# Patient Record
Sex: Female | Born: 1990 | Race: White | Hispanic: No | Marital: Single | Smoking: Never smoker
Health system: Southern US, Community
[De-identification: ages and names within clinical notes are randomized; demographics above are authoritative.]

## PROBLEM LIST (undated history)

## (undated) HISTORY — PX: OTHER SURGICAL HISTORY: SHX169

---
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
## 2010-02-23 ENCOUNTER — Emergency Department (HOSPITAL_COMMUNITY): Admission: EM | Admit: 2010-02-23 | Discharge: 2010-02-23 | Payer: Self-pay | Admitting: Emergency Medicine

## 2011-01-17 ENCOUNTER — Emergency Department (HOSPITAL_BASED_OUTPATIENT_CLINIC_OR_DEPARTMENT_OTHER)
Admission: EM | Admit: 2011-01-17 | Discharge: 2011-01-17 | Disposition: A | Discharge status: Home / Self Care | Payer: Self-pay | Attending: Emergency Medicine | Admitting: Emergency Medicine

## 2011-01-17 DIAGNOSIS — R51 Headache: Secondary | ICD-10-CM | POA: Insufficient documentation

## 2011-02-11 LAB — POCT I-STAT, CHEM 8
BUN: 12 mg/dL (ref 6–23)
Calcium, Ion: 1.24 mmol/L (ref 1.12–1.32)
Chloride: 108 mEq/L (ref 96–112)
Creatinine, Ser: 0.6 mg/dL (ref 0.4–1.2)
Glucose, Bld: 96 mg/dL (ref 70–99)

## 2013-05-20 ENCOUNTER — Encounter (HOSPITAL_COMMUNITY): Payer: Self-pay | Admitting: *Deleted

## 2013-05-20 ENCOUNTER — Emergency Department (HOSPITAL_COMMUNITY): Payer: Self-pay

## 2013-05-20 ENCOUNTER — Emergency Department (HOSPITAL_COMMUNITY)
Admission: EM | Admit: 2013-05-20 | Discharge: 2013-05-20 | Disposition: A | Discharge status: Home / Self Care | Payer: Self-pay | Attending: Emergency Medicine | Admitting: Emergency Medicine

## 2013-05-20 DIAGNOSIS — R1310 Dysphagia, unspecified: Secondary | ICD-10-CM | POA: Insufficient documentation

## 2013-05-20 DIAGNOSIS — J029 Acute pharyngitis, unspecified: Secondary | ICD-10-CM | POA: Insufficient documentation

## 2013-05-20 MED ORDER — ONDANSETRON 8 MG PO TBDP: 8.0000 mg | ORAL_TABLET | Freq: Once | ORAL | Status: DC

## 2013-05-20 MED ORDER — IOHEXOL 300 MG/ML  SOLN
100.0000 mL | Freq: Once | INTRAMUSCULAR | Status: AC | PRN
  Administered 2013-05-20: 100 mL via INTRAVENOUS

## 2013-05-20 MED ORDER — SODIUM CHLORIDE 0.9 % IV SOLN
Freq: Once | INTRAVENOUS | Status: AC
  Administered 2013-05-20: 20 mL/h via INTRAVENOUS

## 2013-05-20 NOTE — ED Provider Notes (Signed)
Medical screening examination/treatment/procedure(s) were performed by non-physician practitioner and as supervising physician I was immediately available for consultation/collaboration.   Nahomi Hegner, MD 05/20/13 2327 

## 2013-05-20 NOTE — ED Provider Notes (Signed)
History    CSN: [id number] Arrival date & time 05/20/13  1907  First MD Initiated Contact with Patient 05/20/13 2007     Chief Complaint  Patient presents with  . Foreign Body   (Consider location/radiation/quality/duration/timing/severity/associated sxs/prior Treatment) Patient is a 22 y.o. female presenting with foreign body. The history is provided by the patient.  Foreign Body Location:  Swallowed Suspected object:  Food Pain quality:  Aching and tightness Associated symptoms: sore throat and trouble swallowing   Associated symptoms: no abdominal pain, no cough, no nausea and no voice change   Associated symptoms comment:  She was eating chicken around 1:00 this afternoon and felt as though it got stuck in her throat. She induced vomiting and feels she got the swallowed chicken out but continues to have pain when swallowing. Currently unable to manage her own secretions. No breathing difficulties.  History reviewed. No pertinent past medical history. Past Surgical History  Procedure Laterality Date  . Arm surgery     No family history on file. History  Substance Use Topics  . Smoking status: Never Smoker   . Smokeless tobacco: Not on file  . Alcohol Use: Yes     Comment: occ   OB History   Grav Para Term Preterm Abortions TAB SAB Ect Mult Living                 Review of Systems  Constitutional: Negative for fever.  HENT: Positive for sore throat and trouble swallowing. Negative for voice change.   Respiratory: Negative for cough and shortness of breath.   Gastrointestinal: Negative for nausea and abdominal pain.    Allergies  Review of patient's allergies indicates no known allergies.  Home Medications   Current Outpatient Rx  Name  Route  Sig  Dispense  Refill  . norgestimate-ethinyl estradiol (ORTHO-CYCLEN,SPRINTEC,PREVIFEM) 0.25-35 MG-MCG tablet   Oral   Take 1 tablet by mouth every morning.          BP 124/79  Pulse 97  Temp(Src) 98.9 F (37.2  C) (Oral)  Resp 18  Wt 180 lb (81.647 kg)  SpO2 100%  LMP 04/13/2013 Physical Exam  Constitutional: She is oriented to person, place, and time. She appears well-developed and well-nourished.  HENT:  Head: Normocephalic.  Eyes: Conjunctivae are normal.  Neck: Normal range of motion. Neck supple. No tracheal deviation present. No thyromegaly present.  Cardiovascular: Normal rate and regular rhythm.   Pulmonary/Chest: Effort normal and breath sounds normal. No stridor. She has no wheezes. She has no rales.  Musculoskeletal: Normal range of motion.  Neurological: She is alert and oriented to person, place, and time.  Skin: Skin is warm and dry.  Psychiatric: She has a normal mood and affect.    ED Course  Procedures (including critical care time) Labs Reviewed - No data to display Dg Neck Soft Tissue  05/20/2013   *RADIOLOGY REPORT*  Clinical Data: Eating check-in.  Feels like something is stuck in throat.  NECK SOFT TISSUES - 1+ VIEW  Comparison: None.  Findings: Soft tissue shadows are normal.  Bony and cartilaginous densities are normal.  No sign of radiodense foreign object.  It does appear on the lateral view that there could be an air-fluid level and one of the piriform sinuses.  This is not necessarily a pathologic finding.  IMPRESSION: No radiodense foreign object.  Apparent air-fluid level in one of the piriform sinuses on the lateral view.  I cannot identify this on the frontal view.  This is not definitely a pathologic finding. If concern is high, neck CT could be performed.   Original Report Authenticated By: Paulina Fusi, M.D.  Ct Soft Tissue Neck W Contrast  05/20/2013   *RADIOLOGY REPORT*  Clinical Data: Difficulty swallowing.  Possible chicken bone stuck in throat.  CT NECK WITH CONTRAST  Technique:  Multidetector CT imaging of the neck was performed with intravenous contrast.  Contrast: OMNIPAQUE IOHEXOL 300 MG/ML  SOLN  Comparison: Plain films earlier in the day.   Findings: Clear lung apices. Mild mucosal thickening bilateral maxillary sinuses.  Mucous retention cyst or polyp in the left maxillary sinus.  Normal nasopharynx, oropharynx.  Motion degradation involves the level of the epiglottis and piriform sinuses.  There is no fluid level within the piriform sinus to correspond to the plain film abnormality.  No airway obstruction.  No ossific density to suggest retained chicken bone.  Increased density in the region of the right side of the epiglottis on image 40/series 3 is favored to be artifactual secondary to motion.  Normal larynx.  Upper esophageal dilatation with fluid level within.  Example image 84/series 3.  Normal thyroid gland, submandibular glands, and parotid glands.  No cervical adenopathy.  All vascular structures enhance normally.  IMPRESSION:  1.  Mild motion degradation at the level of the epiglottis and piriform sinuses.  Given this factor, no evidence of ossific foreign body to suggest retained bone. 2.  Dilated upper thoracic esophagus with fluid level within. Although this could be related to dysmotility, distal esophageal obstruction cannot be entirely excluded.  Correlate with localizing symptoms. 3.  Sinus disease.   Original Report Authenticated By: Jeronimo Greaves, M.D.   No diagnosis found. 1. Dysphagia   MDM  Patient vomited after CT study done with resolution of symptoms. She is tolerating PO liquids and has no further nausea. Discussed importance of follow up with GI for further evaluation.  DDx: esophageal stricture vs. Esophageal dysmotility. Stable for discharge.  Arnoldo Hooker, PA-C 05/20/13 2307

## 2013-05-20 NOTE — ED Notes (Signed)
Pt has history of swallowing problems. Ate a piece chicken today, felt like it was in throat, stuck finger in throat to throw it back up, drooling and still feels like something is in her throat

## 2013-05-22 ENCOUNTER — Encounter: Payer: Self-pay | Admitting: Internal Medicine

## 2013-05-22 ENCOUNTER — Telehealth: Payer: Self-pay | Admitting: Internal Medicine

## 2013-05-22 NOTE — Telephone Encounter (Signed)
Left message for patient to call back  

## 2013-05-23 NOTE — Telephone Encounter (Signed)
Phone busy, I will continue to try

## 2013-05-24 NOTE — Telephone Encounter (Signed)
Left message for patient to call back  

## 2013-05-29 NOTE — Telephone Encounter (Signed)
No return call from the patient.  Will await a return call 

## 2013-06-14 ENCOUNTER — Ambulatory Visit: Payer: Self-pay | Admitting: Internal Medicine

## 2014-02-21 ENCOUNTER — Emergency Department (HOSPITAL_BASED_OUTPATIENT_CLINIC_OR_DEPARTMENT_OTHER)
Admission: EM | Admit: 2014-02-21 | Discharge: 2014-02-21 | Disposition: A | Discharge status: Home / Self Care | Payer: Self-pay | Attending: Emergency Medicine | Admitting: Emergency Medicine

## 2014-02-21 ENCOUNTER — Encounter (HOSPITAL_BASED_OUTPATIENT_CLINIC_OR_DEPARTMENT_OTHER): Payer: Self-pay | Admitting: Emergency Medicine

## 2014-02-21 DIAGNOSIS — J020 Streptococcal pharyngitis: Secondary | ICD-10-CM | POA: Insufficient documentation

## 2014-02-21 LAB — RAPID STREP SCREEN (MED CTR MEBANE ONLY): STREPTOCOCCUS, GROUP A SCREEN (DIRECT): POSITIVE — AB

## 2014-02-21 MED ORDER — PENICILLIN V POTASSIUM 500 MG PO TABS: 500.0000 mg | ORAL_TABLET | Freq: Four times a day (QID) | ORAL | Status: AC

## 2014-02-21 MED ORDER — HYDROCODONE-ACETAMINOPHEN 5-325 MG PO TABS: 2.0000 | ORAL_TABLET | ORAL | Status: AC | PRN

## 2014-02-21 NOTE — ED Notes (Signed)
Pt works in a [employer] and has been exposed to strep.  Denies fever.

## 2014-02-21 NOTE — Discharge Instructions (Signed)
Strep Throat  Strep throat is an infection of the throat caused by a bacteria named Streptococcus pyogenes. Your caregiver may call the infection streptococcal "tonsillitis" or "pharyngitis" depending on whether there are signs of inflammation in the tonsils or back of the throat. Strep throat is most common in children aged 25 15 years during the cold months of the year, but it can occur in people of any age during any season. This infection is spread from person to person (contagious) through coughing, sneezing, or other close contact.  SYMPTOMS   · Fever or chills.  · Painful, swollen, red tonsils or throat.  · Pain or difficulty when swallowing.  · White or yellow spots on the tonsils or throat.  · Swollen, tender lymph nodes or "glands" of the neck or under the jaw.  · Red rash all over the body (rare).  DIAGNOSIS   Many different infections can cause the same symptoms. A test must be done to confirm the diagnosis so the right treatment can be given. A "rapid strep test" can help your caregiver make the diagnosis in a few minutes. If this test is not available, a light swab of the infected area can be used for a throat culture test. If a throat culture test is done, results are usually available in a day or two.  TREATMENT   Strep throat is treated with antibiotic medicine.  HOME CARE INSTRUCTIONS   · Gargle with 1 tsp of salt in 1 cup of warm water, 3 4 times per day or as needed for comfort.  · Family members who also have a sore throat or fever should be tested for strep throat and treated with antibiotics if they have the strep infection.  · Make sure everyone in your household washes their hands well.  · Do not share food, drinking cups, or personal items that could cause the infection to spread to others.  · You may need to eat a soft food diet until your sore throat gets better.  · Drink enough water and fluids to keep your urine clear or pale yellow. This will help prevent dehydration.  · Get plenty of  rest.  · Stay home from school, daycare, or work until you have been on antibiotics for 24 hours.  · Only take over-the-counter or prescription medicines for pain, discomfort, or fever as directed by your caregiver.  · If antibiotics are prescribed, take them as directed. Finish them even if you start to feel better.  SEEK MEDICAL CARE IF:   · The glands in your neck continue to enlarge.  · You develop a rash, cough, or earache.  · You cough up green, yellow-brown, or bloody sputum.  · You have pain or discomfort not controlled by medicines.  · Your problems seem to be getting worse rather than better.  SEEK IMMEDIATE MEDICAL CARE IF:   · You develop any new symptoms such as vomiting, severe headache, stiff or painful neck, chest pain, shortness of breath, or trouble swallowing.  · You develop severe throat pain, drooling, or changes in your voice.  · You develop swelling of the neck, or the skin on the neck becomes red and tender.  · You have a fever.  · You develop signs of dehydration, such as fatigue, dry mouth, and decreased urination.  · You become increasingly sleepy, or you cannot wake up completely.  Document Released: 11/06/2000 Document Revised: 10/26/2012 Document Reviewed: 01/08/2011  [employer]® Patient Information ©2014 [employer].

## 2014-02-21 NOTE — ED Notes (Signed)
Pt sts sore throat, especially worse on left side, difficulty swallowing; hoarse voice noted. Possible fever at home, denies n/v/d/

## 2014-02-21 NOTE — ED Provider Notes (Signed)
CSN: [id number]     Arrival date & time 02/21/14  1720 History   First MD Initiated Contact with Patient 02/21/14 1812     Chief Complaint  Patient presents with  . Sore Throat     (Consider location/radiation/quality/duration/timing/severity/associated sxs/prior Treatment) Patient is a 23 y.o. female presenting with pharyngitis. The history is provided by the patient. No language interpreter was used.  Sore Throat This is a new problem. The current episode started today. The problem occurs constantly. The problem has been gradually worsening. Associated symptoms include a sore throat. Nothing aggravates the symptoms. She has tried nothing for the symptoms. The treatment provided moderate relief.  Pt complains of pain with swallowing and swollen tonsils  History reviewed. No pertinent past medical history. Past Surgical History  Procedure Laterality Date  . Arm surgery     History reviewed. No pertinent family history. History  Substance Use Topics  . Smoking status: Never Smoker   . Smokeless tobacco: Not on file  . Alcohol Use: Yes     Comment: occ   OB History   Grav Para Term Preterm Abortions TAB SAB Ect Mult Living                 Review of Systems  HENT: Positive for sore throat.   All other systems reviewed and are negative.      Allergies  Review of patient's allergies indicates no known allergies.  Home Medications   Current Outpatient Rx  Name  Route  Sig  Dispense  Refill  . norgestimate-ethinyl estradiol (ORTHO-CYCLEN,SPRINTEC,PREVIFEM) 0.25-35 MG-MCG tablet   Oral   Take 1 tablet by mouth every morning.          BP 115/69  Pulse 118  Temp(Src) 99.1 F (37.3 C) (Oral)  Resp 20  Ht 5\' 7"  (1.702 m)  Wt 180 lb (81.647 kg)  BMI 28.19 kg/m2  SpO2 100%  LMP 01/25/2014 Physical Exam  Nursing note and vitals reviewed. Constitutional: She is oriented to person, place, and time. She appears well-developed and well-nourished.  HENT:  Right Ear:  External ear normal.  Left Ear: External ear normal.  Swollen exudative tonsils   Eyes: Conjunctivae are normal. Pupils are equal, round, and reactive to light.  Neck: Normal range of motion. Neck supple.  Cardiovascular: Normal rate and regular rhythm.   Pulmonary/Chest: Effort normal and breath sounds normal.  Abdominal: Soft.  Musculoskeletal: Normal range of motion.  Neurological: She is alert and oriented to person, place, and time. She has normal reflexes.  Skin: Skin is warm.  Psychiatric: She has a normal mood and affect.    ED Course  Procedures (including critical care time) Labs Review Labs Reviewed  RAPID STREP SCREEN   Imaging Review No results found.   EKG Interpretation None      MDM   Final diagnoses:  Strep throat    Strep  Positive  Pcnvk 500mg  qid Hydrocodone    Elson AreasLeslie K Leyton Magoon, PA-C 02/21/14 1838

## 2014-02-21 NOTE — ED Provider Notes (Signed)
Medical screening examination/treatment/procedure(s) were performed by non-physician practitioner and as supervising physician I was immediately available for consultation/collaboration.   EKG Interpretation None        Kaida Games, MD 02/21/14 2352 

## 2014-10-28 IMAGING — CT CT NECK W/ CM
2 of 3 series · 8 of 14 positions shown, 10 images · IV contrast (OMNIPAQUE 300)
Comparison: Plain films earlier in the day.

CLINICAL DATA: Difficulty swallowing.  Possible chicken bone stuck
in throat.

CT NECK WITH CONTRAST
TECHNIQUE: Multidetector CT imaging of the neck was performed with
intravenous contrast.
Contrast: 100mL OMNIPAQUE IOHEXOL 300 MG/ML  SOLN

[Series 3: neck with st · axial · 0.31mm/px · z∈[-750,-666]mm · 3 of 84 slices shown]
[im 21/84  bone]
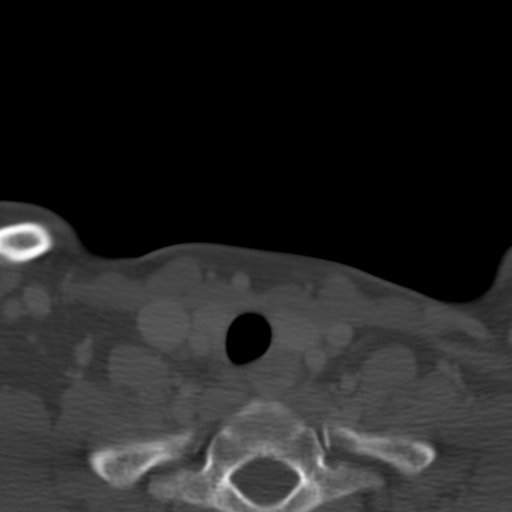
[im 42/84  bone]
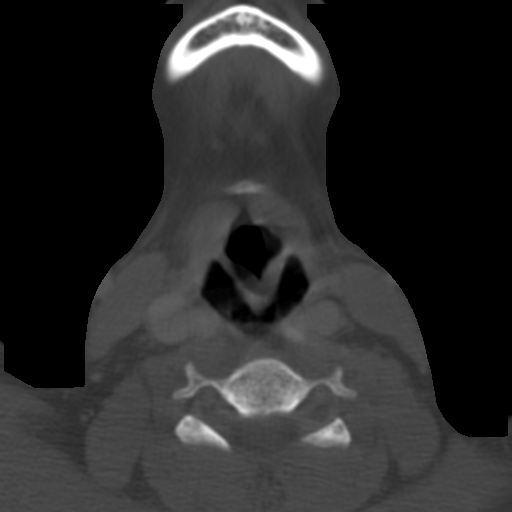
[im 63/84  bone]
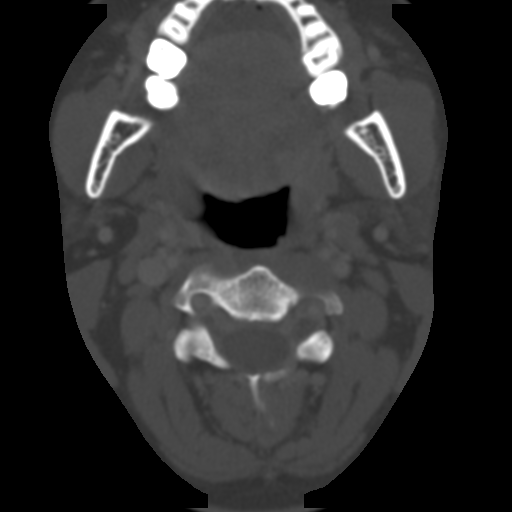

[Series 6: axial recons · axial · 0.27mm/px · z∈[-792,-672]mm · 5 of 96 slices shown, 7 images]
[im 16/96  soft-tissue]
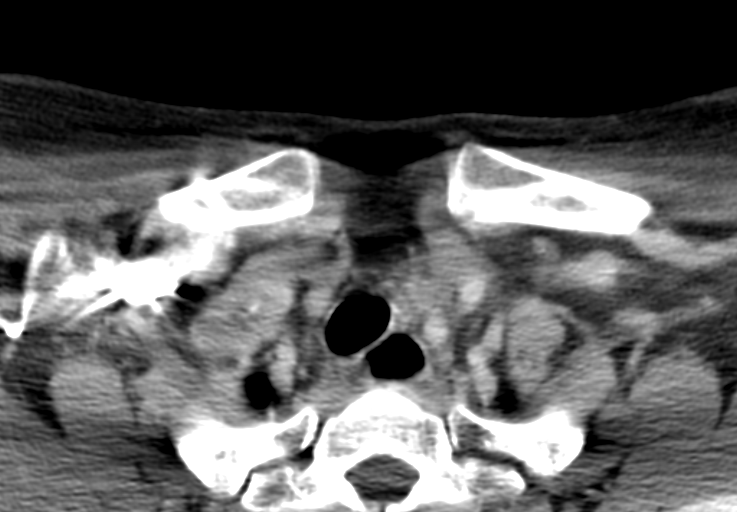
[im 16/96  bone]
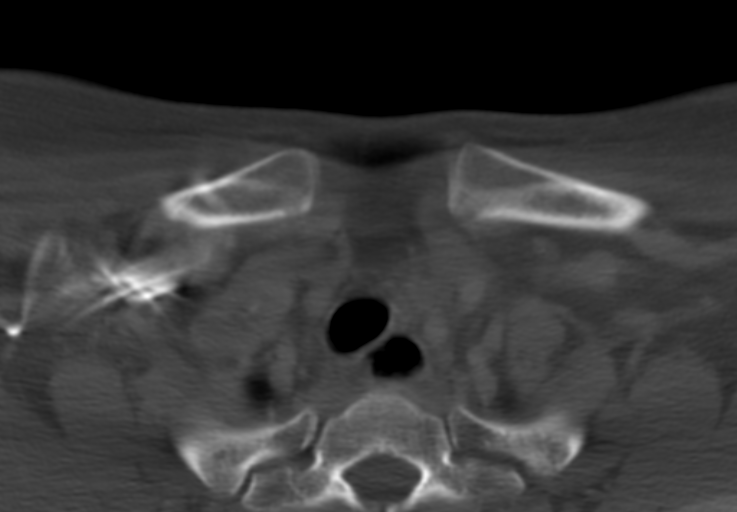
[im 32/96  bone]
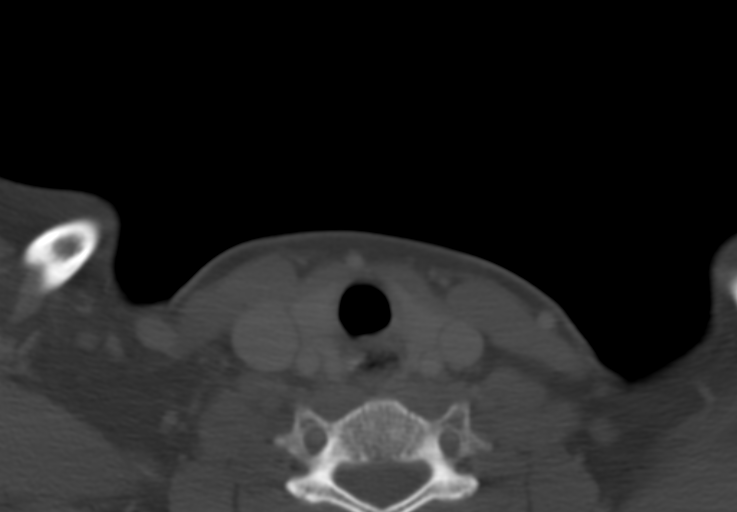
[im 48/96  bone]
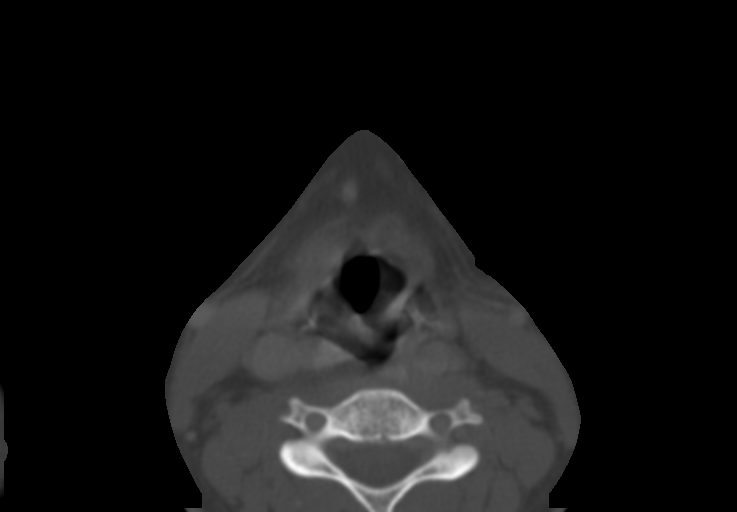
[im 64/96  bone]
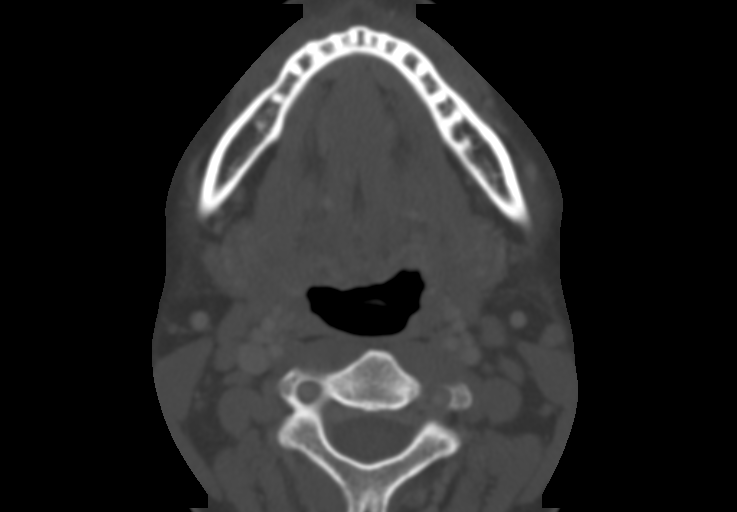
[im 80/96  soft-tissue]
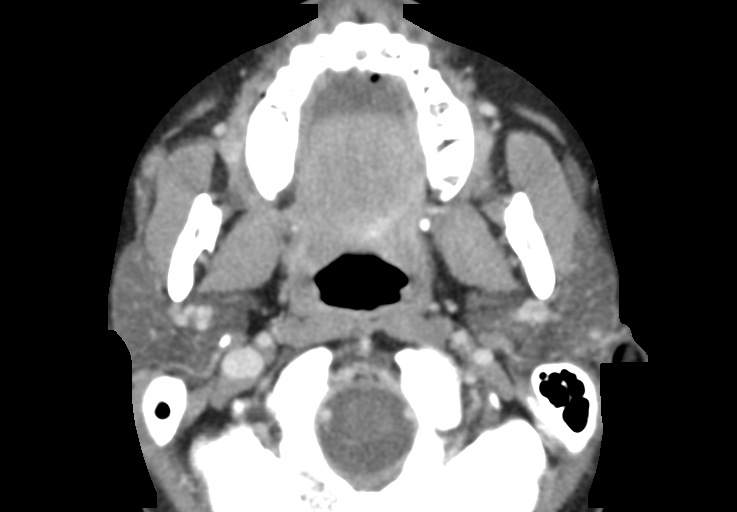
[im 80/96  bone]
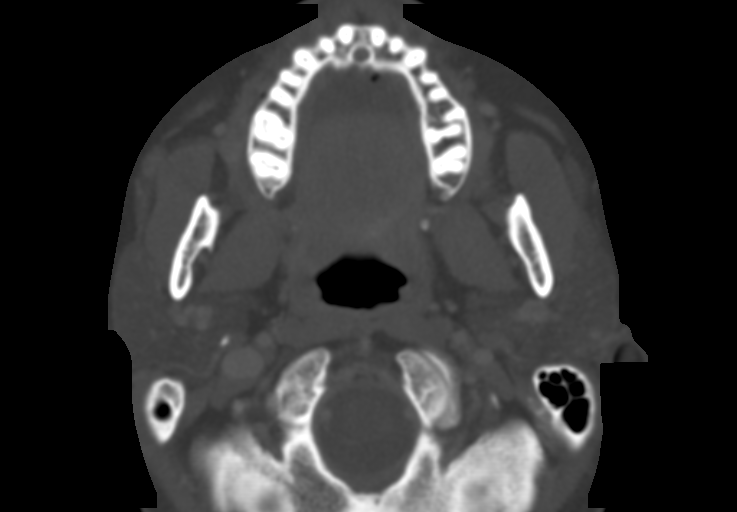

[8 of 14 positions shown; findings below may reference images not displayed]

FINDINGS: Clear lung apices. Mild mucosal thickening bilateral
maxillary sinuses.  Mucous retention cyst or polyp in the left
maxillary sinus.

Normal nasopharynx, oropharynx.  Motion degradation involves the
level of the epiglottis and piriform sinuses.  There is no fluid
level within the piriform sinus to correspond to the plain film
abnormality.  No airway obstruction.  No ossific density to suggest
retained chicken bone.  Increased density in the region of the
right side of the epiglottis on image 40/series 3 is favored to be
artifactual secondary to motion.

Normal larynx.

Upper esophageal dilatation with fluid level within.  Example image
84/series 3.  Normal thyroid gland, submandibular glands, and
parotid glands.  No cervical adenopathy.  All vascular structures
enhance normally.
IMPRESSION: 1.  Mild motion degradation at the level of the epiglottis and
piriform sinuses.  Given this factor, no evidence of ossific
foreign body to suggest retained bone.
2.  Dilated upper thoracic esophagus with fluid level within.
Although this could be related to dysmotility, distal esophageal
obstruction cannot be entirely excluded.  Correlate with localizing
symptoms.
3.  Sinus disease.
